# Patient Record
Sex: Male | Born: 1975 | Race: White | Hispanic: No | Marital: Married | Smoking: Never smoker
Health system: Southern US, Community
[De-identification: ages and names within clinical notes are randomized; demographics above are authoritative.]

## PROBLEM LIST (undated history)

## (undated) DIAGNOSIS — I10 Essential (primary) hypertension: Secondary | ICD-10-CM

## (undated) HISTORY — PX: SHOULDER SURGERY: SHX246

## (undated) HISTORY — PX: CLAVICLE SURGERY: SHX598

---

## 2010-04-04 ENCOUNTER — Emergency Department (HOSPITAL_COMMUNITY): Admission: AC | Admit: 2010-04-04 | Discharge: 2010-04-04 | Payer: Self-pay | Admitting: Emergency Medicine

## 2010-05-06 ENCOUNTER — Encounter: Admission: RE | Admit: 2010-05-06 | Discharge: 2010-05-06 | Payer: Self-pay | Admitting: Internal Medicine

## 2010-05-06 ENCOUNTER — Other Ambulatory Visit: Admission: RE | Admit: 2010-05-06 | Discharge: 2010-05-06 | Payer: Self-pay | Admitting: Diagnostic Radiology

## 2010-06-30 ENCOUNTER — Ambulatory Visit (HOSPITAL_COMMUNITY): Admission: RE | Admit: 2010-06-30 | Payer: Self-pay | Source: Home / Self Care | Admitting: General Surgery

## 2010-09-11 ENCOUNTER — Emergency Department (HOSPITAL_COMMUNITY): Payer: No Typology Code available for payment source

## 2010-09-11 ENCOUNTER — Inpatient Hospital Stay (HOSPITAL_COMMUNITY): Payer: No Typology Code available for payment source

## 2010-09-11 ENCOUNTER — Inpatient Hospital Stay (HOSPITAL_COMMUNITY)
Admission: EM | Admit: 2010-09-11 | Discharge: 2010-09-17 | DRG: 516 | Disposition: A | Discharge status: Home Health | Payer: No Typology Code available for payment source | Source: Ambulatory Visit | Attending: Surgery | Admitting: Surgery

## 2010-09-11 DIAGNOSIS — Y998 Other external cause status: Secondary | ICD-10-CM

## 2010-09-11 DIAGNOSIS — S2249XA Multiple fractures of ribs, unspecified side, initial encounter for closed fracture: Secondary | ICD-10-CM | POA: Diagnosis present

## 2010-09-11 DIAGNOSIS — T148XXA Other injury of unspecified body region, initial encounter: Secondary | ICD-10-CM

## 2010-09-11 DIAGNOSIS — J9383 Other pneumothorax: Secondary | ICD-10-CM | POA: Diagnosis present

## 2010-09-11 DIAGNOSIS — F17200 Nicotine dependence, unspecified, uncomplicated: Secondary | ICD-10-CM | POA: Diagnosis present

## 2010-09-11 DIAGNOSIS — S42009A Fracture of unspecified part of unspecified clavicle, initial encounter for closed fracture: Secondary | ICD-10-CM | POA: Diagnosis present

## 2010-09-11 DIAGNOSIS — IMO0002 Reserved for concepts with insufficient information to code with codable children: Secondary | ICD-10-CM | POA: Diagnosis present

## 2010-09-11 DIAGNOSIS — S42109A Fracture of unspecified part of scapula, unspecified shoulder, initial encounter for closed fracture: Secondary | ICD-10-CM | POA: Diagnosis present

## 2010-09-11 DIAGNOSIS — E876 Hypokalemia: Secondary | ICD-10-CM | POA: Diagnosis present

## 2010-09-11 DIAGNOSIS — K5900 Constipation, unspecified: Secondary | ICD-10-CM | POA: Diagnosis present

## 2010-09-11 DIAGNOSIS — S32409A Unspecified fracture of unspecified acetabulum, initial encounter for closed fracture: Principal | ICD-10-CM | POA: Diagnosis present

## 2010-09-11 DIAGNOSIS — T07XXXA Unspecified multiple injuries, initial encounter: Secondary | ICD-10-CM | POA: Diagnosis present

## 2010-09-11 LAB — CBC
HCT: 38.5 % — ABNORMAL LOW (ref 39.0–52.0)
MCHC: 36.4 g/dL — ABNORMAL HIGH (ref 30.0–36.0)
Platelets: 236 10*3/uL (ref 150–400)
RDW: 12 % (ref 11.5–15.5)
WBC: 7.1 10*3/uL (ref 4.0–10.5)

## 2010-09-11 LAB — PROTIME-INR: INR: 1.12 (ref 0.00–1.49)

## 2010-09-11 LAB — COMPREHENSIVE METABOLIC PANEL
Alkaline Phosphatase: 45 U/L (ref 39–117)
BUN: 7 mg/dL (ref 6–23)
Creatinine, Ser: 1.09 mg/dL (ref 0.4–1.5)
Glucose, Bld: 134 mg/dL — ABNORMAL HIGH (ref 70–99)
Potassium: 2.5 mEq/L — CL (ref 3.5–5.1)
Total Bilirubin: 0.9 mg/dL (ref 0.3–1.2)
Total Protein: 6.2 g/dL (ref 6.0–8.3)

## 2010-09-11 LAB — POCT I-STAT, CHEM 8
Glucose, Bld: 133 mg/dL — ABNORMAL HIGH (ref 70–99)
HCT: 40 % (ref 39.0–52.0)
Hemoglobin: 13.6 g/dL (ref 13.0–17.0)
Potassium: 2.6 meq/L — CL (ref 3.5–5.1)
Sodium: 140 meq/L (ref 135–145)

## 2010-09-11 LAB — MAGNESIUM: Magnesium: 2 mg/dL (ref 1.5–2.5)

## 2010-09-11 LAB — MRSA PCR SCREENING: MRSA by PCR: POSITIVE — AB

## 2010-09-11 MED ORDER — IOHEXOL 300 MG/ML  SOLN
80.0000 mL | Freq: Once | INTRAMUSCULAR | Status: AC | PRN
  Administered 2010-09-11: 80 mL via INTRAVENOUS

## 2010-09-12 ENCOUNTER — Inpatient Hospital Stay (HOSPITAL_COMMUNITY): Payer: No Typology Code available for payment source

## 2010-09-12 LAB — CBC
MCH: 30.9 pg (ref 26.0–34.0)
MCV: 89.4 fL (ref 78.0–100.0)
Platelets: 194 10*3/uL (ref 150–400)
RDW: 12.3 % (ref 11.5–15.5)

## 2010-09-12 LAB — BASIC METABOLIC PANEL
BUN: 5 mg/dL — ABNORMAL LOW (ref 6–23)
CO2: 26 mEq/L (ref 19–32)
Chloride: 106 mEq/L (ref 96–112)
Creatinine, Ser: 0.95 mg/dL (ref 0.4–1.5)

## 2010-09-13 ENCOUNTER — Inpatient Hospital Stay (HOSPITAL_COMMUNITY): Payer: No Typology Code available for payment source

## 2010-09-13 LAB — BASIC METABOLIC PANEL
Calcium: 8.6 mg/dL (ref 8.4–10.5)
GFR calc Af Amer: >60 mL/min (ref >60)
GFR calc non Af Amer: >60 mL/min (ref >60)
Sodium: 137 mEq/L (ref 135–145)

## 2010-09-13 LAB — CBC
MCHC: 33.9 g/dL (ref 30.0–36.0)
Platelets: 189 10*3/uL (ref 150–400)
RDW: 12.1 % (ref 11.5–15.5)

## 2010-09-14 ENCOUNTER — Inpatient Hospital Stay (HOSPITAL_COMMUNITY): Payer: No Typology Code available for payment source

## 2010-09-15 ENCOUNTER — Inpatient Hospital Stay (HOSPITAL_COMMUNITY): Payer: No Typology Code available for payment source

## 2010-09-15 LAB — CBC
Hemoglobin: 14.7 g/dL (ref 13.0–17.0)
MCHC: 35.2 g/dL (ref 30.0–36.0)
RBC: 4.65 MIL/uL (ref 4.22–5.81)

## 2010-09-15 LAB — TYPE AND SCREEN
ABO/RH(D): A POS
Antibody Screen: NEGATIVE

## 2010-09-15 LAB — BASIC METABOLIC PANEL
Calcium: 9 mg/dL (ref 8.4–10.5)
GFR calc Af Amer: >60 mL/min (ref >60)
GFR calc non Af Amer: >60 mL/min (ref >60)
Glucose, Bld: 97 mg/dL (ref 70–99)
Sodium: 138 mEq/L (ref 135–145)

## 2010-09-16 ENCOUNTER — Inpatient Hospital Stay (HOSPITAL_COMMUNITY): Payer: No Typology Code available for payment source

## 2010-09-16 LAB — CBC
Hemoglobin: 12.3 g/dL — ABNORMAL LOW (ref 13.0–17.0)
RBC: 3.97 MIL/uL — ABNORMAL LOW (ref 4.22–5.81)
WBC: 7.8 10*3/uL (ref 4.0–10.5)

## 2010-09-16 LAB — BASIC METABOLIC PANEL
CO2: 28 mEq/L (ref 19–32)
Calcium: 8.5 mg/dL (ref 8.4–10.5)
Chloride: 104 mEq/L (ref 96–112)
GFR calc Af Amer: >60 mL/min (ref >60)
Potassium: 4.2 mEq/L (ref 3.5–5.1)
Sodium: 138 mEq/L (ref 135–145)

## 2010-09-17 ENCOUNTER — Inpatient Hospital Stay (HOSPITAL_COMMUNITY): Payer: No Typology Code available for payment source
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
## 2016-12-24 ENCOUNTER — Other Ambulatory Visit: Payer: Self-pay | Admitting: Otolaryngology

## 2017-02-18 ENCOUNTER — Ambulatory Visit (HOSPITAL_COMMUNITY): Admission: RE | Admit: 2017-02-18 | Payer: 59 | Source: Ambulatory Visit | Admitting: Otolaryngology

## 2017-02-18 ENCOUNTER — Encounter (HOSPITAL_COMMUNITY): Admission: RE | Payer: Self-pay | Source: Ambulatory Visit

## 2017-02-18 SURGERY — MICROLARYNGOSCOPY WITH CO2 LASER AND EXCISION OF VOCAL CORD LESION
Anesthesia: General

## 2019-03-16 ENCOUNTER — Ambulatory Visit (HOSPITAL_COMMUNITY)
Admission: EM | Admit: 2019-03-16 | Discharge: 2019-03-16 | Disposition: A | Discharge status: Home / Self Care | Payer: No Typology Code available for payment source | Attending: Emergency Medicine | Admitting: Emergency Medicine

## 2019-03-16 ENCOUNTER — Other Ambulatory Visit: Payer: Self-pay

## 2019-03-16 ENCOUNTER — Encounter (HOSPITAL_COMMUNITY): Payer: Self-pay

## 2019-03-16 ENCOUNTER — Ambulatory Visit (INDEPENDENT_AMBULATORY_CARE_PROVIDER_SITE_OTHER): Payer: No Typology Code available for payment source

## 2019-03-16 DIAGNOSIS — S62306A Unspecified fracture of fifth metacarpal bone, right hand, initial encounter for closed fracture: Secondary | ICD-10-CM | POA: Diagnosis not present

## 2019-03-16 DIAGNOSIS — I10 Essential (primary) hypertension: Secondary | ICD-10-CM

## 2019-03-16 DIAGNOSIS — W1789XA Other fall from one level to another, initial encounter: Secondary | ICD-10-CM

## 2019-03-16 HISTORY — DX: Essential (primary) hypertension: I10

## 2019-03-16 NOTE — Discharge Instructions (Signed)
Ice, elevation, use of ibuprofen or aleve as needed for pain.  Keep splint on at all times.  Please follow up with orthopedics for definitive treatment plan.
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
## 2019-03-16 NOTE — ED Triage Notes (Signed)
Pt presents with right hand swelling and bruising after a fall out of his truck yesterday and landing on it.

## 2020-08-19 ENCOUNTER — Encounter: Payer: Self-pay | Admitting: Physical Medicine & Rehabilitation

## 2020-10-02 ENCOUNTER — Encounter
Payer: No Typology Code available for payment source | Attending: Physical Medicine & Rehabilitation | Admitting: Physical Medicine & Rehabilitation

## 2020-10-02 ENCOUNTER — Other Ambulatory Visit: Payer: Self-pay

## 2020-10-02 ENCOUNTER — Encounter: Payer: Self-pay | Admitting: Physical Medicine & Rehabilitation

## 2020-10-02 VITALS — BP 138/93 | HR 95 | Temp 99.5°F | Ht 72.0 in | Wt 197.8 lb

## 2020-10-02 DIAGNOSIS — M4716 Other spondylosis with myelopathy, lumbar region: Secondary | ICD-10-CM | POA: Diagnosis not present

## 2020-10-02 NOTE — Patient Instructions (Signed)
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
## 2020-11-07 ENCOUNTER — Ambulatory Visit: Payer: No Typology Code available for payment source | Admitting: Physical Medicine & Rehabilitation

## 2020-11-11 ENCOUNTER — Encounter
Payer: No Typology Code available for payment source | Attending: Physical Medicine & Rehabilitation | Admitting: Physical Medicine & Rehabilitation

## 2020-11-11 ENCOUNTER — Encounter: Payer: Self-pay | Admitting: Physical Medicine & Rehabilitation

## 2020-11-11 ENCOUNTER — Other Ambulatory Visit: Payer: Self-pay

## 2020-11-11 VITALS — BP 157/75 | HR 92 | Temp 99.0°F | Ht 72.0 in | Wt 199.0 lb

## 2020-11-11 DIAGNOSIS — M7918 Myalgia, other site: Secondary | ICD-10-CM | POA: Diagnosis present

## 2020-11-11 NOTE — Patient Instructions (Signed)
Please call if you need medial branch blocks
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
## 2020-11-13 ENCOUNTER — Ambulatory Visit: Payer: No Typology Code available for payment source | Admitting: Physical Medicine & Rehabilitation

## 2020-11-27 ENCOUNTER — Ambulatory Visit (HOSPITAL_BASED_OUTPATIENT_CLINIC_OR_DEPARTMENT_OTHER)
Payer: No Typology Code available for payment source | Attending: Physical Medicine & Rehabilitation | Admitting: Physical Therapy

## 2020-11-27 ENCOUNTER — Other Ambulatory Visit: Payer: Self-pay

## 2020-11-27 ENCOUNTER — Encounter (HOSPITAL_BASED_OUTPATIENT_CLINIC_OR_DEPARTMENT_OTHER): Payer: Self-pay | Admitting: Physical Therapy

## 2020-11-27 DIAGNOSIS — M62830 Muscle spasm of back: Secondary | ICD-10-CM

## 2020-11-27 DIAGNOSIS — G8929 Other chronic pain: Secondary | ICD-10-CM | POA: Diagnosis present

## 2020-11-27 DIAGNOSIS — M545 Low back pain, unspecified: Secondary | ICD-10-CM | POA: Diagnosis not present
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
## 2020-12-01 ENCOUNTER — Encounter (HOSPITAL_BASED_OUTPATIENT_CLINIC_OR_DEPARTMENT_OTHER): Payer: No Typology Code available for payment source | Admitting: Physical Therapy

## 2020-12-03 ENCOUNTER — Ambulatory Visit (HOSPITAL_BASED_OUTPATIENT_CLINIC_OR_DEPARTMENT_OTHER): Payer: No Typology Code available for payment source | Admitting: Physical Therapy

## 2020-12-04 ENCOUNTER — Ambulatory Visit (HOSPITAL_BASED_OUTPATIENT_CLINIC_OR_DEPARTMENT_OTHER): Payer: No Typology Code available for payment source | Admitting: Physical Therapy

## 2020-12-04 ENCOUNTER — Other Ambulatory Visit: Payer: Self-pay

## 2020-12-04 ENCOUNTER — Encounter (HOSPITAL_BASED_OUTPATIENT_CLINIC_OR_DEPARTMENT_OTHER): Payer: Self-pay | Admitting: Physical Therapy

## 2020-12-04 DIAGNOSIS — M62830 Muscle spasm of back: Secondary | ICD-10-CM

## 2020-12-04 DIAGNOSIS — G8929 Other chronic pain: Secondary | ICD-10-CM

## 2020-12-04 DIAGNOSIS — M545 Low back pain, unspecified: Secondary | ICD-10-CM | POA: Diagnosis not present
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
## 2020-12-08 ENCOUNTER — Ambulatory Visit (HOSPITAL_BASED_OUTPATIENT_CLINIC_OR_DEPARTMENT_OTHER): Payer: No Typology Code available for payment source | Admitting: Physical Therapy

## 2020-12-11 ENCOUNTER — Ambulatory Visit (HOSPITAL_BASED_OUTPATIENT_CLINIC_OR_DEPARTMENT_OTHER): Payer: No Typology Code available for payment source | Admitting: Physical Therapy

## 2020-12-11 ENCOUNTER — Other Ambulatory Visit: Payer: Self-pay

## 2020-12-11 ENCOUNTER — Encounter (HOSPITAL_BASED_OUTPATIENT_CLINIC_OR_DEPARTMENT_OTHER): Payer: Self-pay | Admitting: Physical Therapy

## 2020-12-11 DIAGNOSIS — G8929 Other chronic pain: Secondary | ICD-10-CM

## 2020-12-11 DIAGNOSIS — M62830 Muscle spasm of back: Secondary | ICD-10-CM

## 2020-12-11 DIAGNOSIS — M545 Low back pain, unspecified: Secondary | ICD-10-CM | POA: Diagnosis not present
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
## 2020-12-15 ENCOUNTER — Ambulatory Visit (HOSPITAL_BASED_OUTPATIENT_CLINIC_OR_DEPARTMENT_OTHER): Payer: No Typology Code available for payment source | Admitting: Physical Therapy

## 2020-12-15 ENCOUNTER — Encounter (HOSPITAL_BASED_OUTPATIENT_CLINIC_OR_DEPARTMENT_OTHER): Payer: Self-pay | Admitting: Physical Therapy

## 2020-12-15 ENCOUNTER — Other Ambulatory Visit: Payer: Self-pay

## 2020-12-15 DIAGNOSIS — M62830 Muscle spasm of back: Secondary | ICD-10-CM

## 2020-12-15 DIAGNOSIS — G8929 Other chronic pain: Secondary | ICD-10-CM

## 2020-12-15 DIAGNOSIS — M545 Low back pain, unspecified: Secondary | ICD-10-CM | POA: Diagnosis not present
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
## 2020-12-16 ENCOUNTER — Encounter
Payer: No Typology Code available for payment source | Attending: Physical Medicine & Rehabilitation | Admitting: Physical Medicine & Rehabilitation

## 2020-12-16 DIAGNOSIS — M7918 Myalgia, other site: Secondary | ICD-10-CM | POA: Insufficient documentation

## 2020-12-29 ENCOUNTER — Ambulatory Visit (HOSPITAL_BASED_OUTPATIENT_CLINIC_OR_DEPARTMENT_OTHER)
Payer: No Typology Code available for payment source | Attending: Physical Medicine & Rehabilitation | Admitting: Physical Therapy

## 2020-12-29 DIAGNOSIS — M545 Low back pain, unspecified: Secondary | ICD-10-CM | POA: Insufficient documentation

## 2020-12-29 DIAGNOSIS — M62830 Muscle spasm of back: Secondary | ICD-10-CM | POA: Insufficient documentation

## 2020-12-29 DIAGNOSIS — G8929 Other chronic pain: Secondary | ICD-10-CM | POA: Insufficient documentation

## 2020-12-31 ENCOUNTER — Ambulatory Visit (HOSPITAL_BASED_OUTPATIENT_CLINIC_OR_DEPARTMENT_OTHER): Payer: No Typology Code available for payment source | Admitting: Physical Therapy

## 2021-05-26 ENCOUNTER — Other Ambulatory Visit: Payer: Self-pay

## 2021-05-26 ENCOUNTER — Ambulatory Visit (HOSPITAL_BASED_OUTPATIENT_CLINIC_OR_DEPARTMENT_OTHER)
Admission: RE | Admit: 2021-05-26 | Discharge: 2021-05-26 | Disposition: A | Discharge status: Home / Self Care | Payer: No Typology Code available for payment source | Source: Ambulatory Visit | Attending: Emergency Medicine | Admitting: Emergency Medicine

## 2021-05-26 ENCOUNTER — Ambulatory Visit
Admission: EM | Admit: 2021-05-26 | Discharge: 2021-05-26 | Disposition: A | Discharge status: Home / Self Care | Payer: No Typology Code available for payment source | Attending: Emergency Medicine | Admitting: Emergency Medicine

## 2021-05-26 DIAGNOSIS — M25531 Pain in right wrist: Secondary | ICD-10-CM | POA: Diagnosis not present

## 2021-05-26 DIAGNOSIS — Y92410 Unspecified street and highway as the place of occurrence of the external cause: Secondary | ICD-10-CM | POA: Insufficient documentation

## 2021-05-26 DIAGNOSIS — M25532 Pain in left wrist: Secondary | ICD-10-CM | POA: Diagnosis not present

## 2021-05-26 MED ORDER — IBUPROFEN 600 MG PO TABS: ORAL_TABLET | ORAL | 0 refills | Status: AC

## 2021-05-26 NOTE — Discharge Instructions (Addendum)
Please go to the [facility] location now to have x-rays of both of the wrists done, their address is [address].  Please go to the main entrance and let them know you have x-rays ordered.  If they have any questions about the x-rays I have ordered them to contact us here at [facility] [phone].    The results will be reported to me within 45 minutes to an hour.  You or your wife will be contacted with the results once they are received.  I provided you with the contact information for [facility] where you can walk in to the clinic if there is any acute fracture seen on either x-ray.  Have also provided you with a prescription for ibuprofen 600 mg to be taken 3 times daily as needed for pain.  If there is no fracture appreciated, I recommend thumb spica splint to both wrists until pain is resolved (image provided), you can also apply ice as needed for pain as well.
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
